# Patient Record
Sex: Male | Born: 2005 | Race: White | Hispanic: No | Marital: Single | Smoking: Never smoker
Health system: Southern US, Community
[De-identification: ages and names within clinical notes are randomized; demographics above are authoritative.]

## PROBLEM LIST (undated history)

## (undated) DIAGNOSIS — I10 Essential (primary) hypertension: Secondary | ICD-10-CM

## (undated) HISTORY — DX: Essential (primary) hypertension: I10

---
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
## 2023-01-15 ENCOUNTER — Encounter: Payer: Self-pay | Admitting: Internal Medicine

## 2023-01-15 ENCOUNTER — Ambulatory Visit: Payer: Federal, State, Local not specified - PPO | Attending: Internal Medicine | Admitting: Internal Medicine

## 2023-01-15 VITALS — BP 126/62 | HR 71 | Ht 69.0 in | Wt 156.0 lb

## 2023-01-15 DIAGNOSIS — I10 Essential (primary) hypertension: Secondary | ICD-10-CM

## 2023-01-15 DIAGNOSIS — Z136 Encounter for screening for cardiovascular disorders: Secondary | ICD-10-CM | POA: Diagnosis not present

## 2023-01-15 DIAGNOSIS — R42 Dizziness and giddiness: Secondary | ICD-10-CM | POA: Diagnosis not present

## 2023-01-15 NOTE — Patient Instructions (Signed)
Medication Instructions:  NO CHANGES  *If you need a refill on your cardiac medications before your next appointment, please call your pharmacy*   Follow-Up: At [facility], you and your health needs are our priority.  As part of our continuing mission to provide you with exceptional heart care, we have created designated Provider Care Teams.  These Care Teams include your primary Cardiologist (physician) and Advanced Practice Providers (APPs -  Physician Assistants and Nurse Practitioners) who all work together to provide you with the care you need, when you need it.  We recommend signing up for the patient portal called "MyChart".  Sign up information is provided on this After Visit Summary.  MyChart is used to connect with patients for Virtual Visits (Telemedicine).  Patients are able to view lab/test results, encounter notes, upcoming appointments, etc.  Non-urgent messages can be sent to your provider as well.   To learn more about what you can do with MyChart, go to [email or web address].    Your next appointment:    12 months with Dr. Hilty  

## 2023-01-15 NOTE — Progress Notes (Signed)
OFFICE CONSULT NOTE  Chief Complaint:  Established cardiologist  Primary Care Physician: No primary care provider on file.  HPI:  Justin Maldonado is a 17 y.o. male who is being seen today for the evaluation of high blood pressure at the request of Cullop, Adele Barthel, NP.  This is a 17 year old male who has a history of high blood pressure.  This is on his father side.  Previously I have seen both his twin brother and sister who have similar histories of hypertension.  He has also received care at pediatric nephrology at [facility] until he aged out of that.  Today blood pressure appears to be well-controlled at 126/62.  According to his mother who accompanied him during the visit he has had some dizziness recently.  It sounds like he may have had some upper respiratory and sinus symptoms which could have contributed to that.  He did have an elevated blood pressure in the 140s, unlikely to be a cause of his dizziness.  Sometimes the dizziness is positional.  That being said he did not have any the symptoms on the blood pressure medications which he has been on for more than 6 months and it seems unlikely to be related to the medicine.  Otherwise he is asymptomatic.  He is currently at [employer] and works in [employer].  PMHx:  Past Medical History:  Diagnosis Date   HTN (hypertension)     History reviewed. No pertinent surgical history.  FAMHx:  Family History  Problem Relation Age of Onset   Hypertension Father    Hypertension Sister    Hypertension Brother     SOCHx:   reports that he has never smoked. He has never used smokeless tobacco. No history on file for alcohol use and drug use.  ALLERGIES:  Allergies  Allergen Reactions   Amoxicillin Rash    ROS: Pertinent items noted in HPI and remainder of comprehensive ROS otherwise negative.  HOME MEDS: Current Outpatient Medications on File Prior to Visit  Medication Sig Dispense Refill   amlodipine-olmesartan (AZOR) 10-20 MG  tablet Take 1 tablet by mouth daily.     Ferrous Sulfate (IRON PO) Take by mouth.     No current facility-administered medications on file prior to visit.    LABS/IMAGING: No results found for this or any previous visit (from the past 48 hour(s)). No results found.  LIPID PANEL: No results found for: "CHOL", "TRIG", "HDL", "CHOLHDL", "VLDL", "LDLCALC", "LDLDIRECT"  WEIGHTS: Wt Readings from Last 3 Encounters:  01/15/23 156 lb (70.8 kg) (54%, Z= 0.09)*   * Growth percentiles are based on CDC (Boys, 2-20 Years) data.    VITALS: BP 126/62 (BP Location: Left Arm, Patient Position: Sitting, Cuff Size: Normal)   Pulse 71   Ht 5\' 9"  (1.753 m)   Wt 156 lb (70.8 kg)   SpO2 99%   BMI 23.04 kg/m   EXAM: General appearance: alert and no distress Lungs: clear to auscultation bilaterally Heart: regular rate and rhythm Extremities: extremities normal, atraumatic, no cyanosis or edema Neurologic: Grossly normal  EKG: EKG Interpretation Date/Time:  Wednesday January 15 2023 16:09:05 EDT Ventricular Rate:  71 PR Interval:  172 QRS Duration:  92 QT Interval:  360 QTC Calculation: 391 R Axis:   73  Text Interpretation: Normal sinus rhythm with sinus arrhythmia Normal ECG No previous ECGs available Confirmed by Zoila Shutter [id number]) on 01/15/2023 4:18:28 PM    ASSESSMENT: Essential hypertension Dizziness  PLAN: 1.   Mr.  Papadakis has essential hypertension as well as to twin siblings with hypertension.  He is established on medical therapy and appears to have good control.  He has had some recent occasional dizziness which I suspect is transient and will resolve.  He has been established on medication for some time and it seems unlikely to be the medication.  EKG is normal.  Would recommend continuing his current therapies.  Follow-up with me annually or sooner as necessary.  Justin Nose, MD, [facility], FACP  [place]  [facility]  Medical Director of the Advanced Lipid  Disorders &  Cardiovascular Risk Reduction Clinic Diplomate of the American Board of Clinical Lipidology Attending Cardiologist  Direct Dial: [phone]  Fax: [phone]  Website:  [email or web address] Nicely Justin Maldonado 01/15/2023, 5:04 PM

## 2023-02-26 DIAGNOSIS — E559 Vitamin D deficiency, unspecified: Secondary | ICD-10-CM | POA: Diagnosis not present

## 2023-02-26 DIAGNOSIS — I10 Essential (primary) hypertension: Secondary | ICD-10-CM | POA: Diagnosis not present

## 2023-05-16 DIAGNOSIS — J309 Allergic rhinitis, unspecified: Secondary | ICD-10-CM | POA: Insufficient documentation

## 2023-06-13 DIAGNOSIS — R112 Nausea with vomiting, unspecified: Secondary | ICD-10-CM | POA: Diagnosis not present

## 2023-06-13 DIAGNOSIS — R197 Diarrhea, unspecified: Secondary | ICD-10-CM | POA: Diagnosis not present

## 2023-06-13 DIAGNOSIS — R109 Unspecified abdominal pain: Secondary | ICD-10-CM | POA: Diagnosis not present

## 2023-06-18 ENCOUNTER — Emergency Department (HOSPITAL_BASED_OUTPATIENT_CLINIC_OR_DEPARTMENT_OTHER)

## 2023-06-18 ENCOUNTER — Other Ambulatory Visit: Payer: Self-pay

## 2023-06-18 ENCOUNTER — Emergency Department (HOSPITAL_BASED_OUTPATIENT_CLINIC_OR_DEPARTMENT_OTHER)
Admission: EM | Admit: 2023-06-18 | Discharge: 2023-06-18 | Disposition: A | Discharge status: Home / Self Care | Attending: Emergency Medicine | Admitting: Emergency Medicine

## 2023-06-18 DIAGNOSIS — Z79899 Other long term (current) drug therapy: Secondary | ICD-10-CM | POA: Insufficient documentation

## 2023-06-18 DIAGNOSIS — Z760 Encounter for issue of repeat prescription: Secondary | ICD-10-CM | POA: Diagnosis not present

## 2023-06-18 DIAGNOSIS — R109 Unspecified abdominal pain: Secondary | ICD-10-CM | POA: Diagnosis not present

## 2023-06-18 DIAGNOSIS — I10 Essential (primary) hypertension: Secondary | ICD-10-CM | POA: Insufficient documentation

## 2023-06-18 DIAGNOSIS — R1084 Generalized abdominal pain: Secondary | ICD-10-CM | POA: Diagnosis not present

## 2023-06-18 LAB — CBC
HCT: 48.1 % (ref 39.0–52.0)
Hemoglobin: 15.8 g/dL (ref 13.0–17.0)
MCH: 26.7 pg (ref 26.0–34.0)
MCHC: 32.8 g/dL (ref 30.0–36.0)
MCV: 81.3 fL (ref 80.0–100.0)
Platelets: 258 10*3/uL (ref 150–400)
RBC: 5.92 MIL/uL — ABNORMAL HIGH (ref 4.22–5.81)
RDW: 13 % (ref 11.5–15.5)
WBC: 9.1 10*3/uL (ref 4.0–10.5)
nRBC: 0 % (ref 0.0–0.2)

## 2023-06-18 LAB — COMPREHENSIVE METABOLIC PANEL
ALT: 13 U/L (ref 0–44)
AST: 13 U/L — ABNORMAL LOW (ref 15–41)
Albumin: 5.1 g/dL — ABNORMAL HIGH (ref 3.5–5.0)
Alkaline Phosphatase: 56 U/L (ref 38–126)
Anion gap: 9 (ref 5–15)
BUN: 9 mg/dL (ref 6–20)
CO2: 27 mmol/L (ref 22–32)
Calcium: 10.4 mg/dL — ABNORMAL HIGH (ref 8.9–10.3)
Chloride: 103 mmol/L (ref 98–111)
Creatinine, Ser: 1.05 mg/dL (ref 0.61–1.24)
GFR, Estimated: >60 mL/min (ref >60)
Glucose, Bld: 111 mg/dL — ABNORMAL HIGH (ref 70–99)
Potassium: 3.7 mmol/L (ref 3.5–5.1)
Sodium: 139 mmol/L (ref 135–145)
Total Bilirubin: 0.5 mg/dL (ref 0.0–1.2)
Total Protein: 7.7 g/dL (ref 6.5–8.1)

## 2023-06-18 LAB — URINALYSIS, ROUTINE W REFLEX MICROSCOPIC
Bilirubin Urine: NEGATIVE
Glucose, UA: NEGATIVE mg/dL
Hgb urine dipstick: NEGATIVE
Leukocytes,Ua: NEGATIVE
Nitrite: NEGATIVE
Specific Gravity, Urine: 1.02 (ref 1.005–1.030)
pH: 8 (ref 5.0–8.0)

## 2023-06-18 LAB — LIPASE, BLOOD: Lipase: 27 U/L (ref 11–51)

## 2023-06-18 MED ORDER — OLMESARTAN MEDOXOMIL 20 MG PO TABS: 20.0000 mg | ORAL_TABLET | Freq: Every day | ORAL | 0 refills | Status: DC

## 2023-06-18 MED ORDER — ONDANSETRON 4 MG PO TBDP: 4.0000 mg | ORAL_TABLET | Freq: Once | ORAL | Status: DC | PRN

## 2023-06-18 MED ORDER — IOHEXOL 300 MG/ML  SOLN
100.0000 mL | Freq: Once | INTRAMUSCULAR | Status: AC | PRN
  Administered 2023-06-18: 100 mL via INTRAVENOUS

## 2023-06-18 MED ORDER — ONDANSETRON 4 MG PO TBDP: 4.0000 mg | ORAL_TABLET | Freq: Three times a day (TID) | ORAL | 0 refills | Status: AC | PRN

## 2023-06-18 MED ORDER — ONDANSETRON HCL 4 MG/2ML IJ SOLN
4.0000 mg | Freq: Once | INTRAMUSCULAR | Status: AC
  Administered 2023-06-18: 4 mg via INTRAVENOUS
  Filled 2023-06-18: qty 2

## 2023-06-18 MED ORDER — SODIUM CHLORIDE 0.9 % IV BOLUS
500.0000 mL | Freq: Once | INTRAVENOUS | Status: AC
  Administered 2023-06-18: 500 mL via INTRAVENOUS

## 2023-06-18 NOTE — ED Notes (Signed)
 Patient transported to CT

## 2023-06-18 NOTE — ED Triage Notes (Signed)
 Pt POV reporting abd pain and nausea past week, seen at [facility] Friday, was told there was blood in his urine and to follow up with PCP, appt not for a few weeks.

## 2023-06-18 NOTE — Discharge Instructions (Addendum)
 CT imaging as well as lab work are reassuring here today.  I recommend staying well-hydrated with water he can alternate Pedialyte and Gatorade.  I have sent Zofran to your pharmacy reviewed you can take as needed for nausea vomiting.  You can also continue taking over-the-counter omeprazole or Pepcid.  If you have loose stools you can take odium or Pepto-Bismol which are available over-the-counter.  I would recommend bland food.  Please follow-up with your primary care doctor as discussed.  I have attached behavioral health urgent care formation to discharge paperwork.  I have sent medication refill to your pharmacy.  Please monitor your blood pressure and have your electrolytes and kidney function rechecked in approximately 1 to 2 weeks.

## 2023-06-18 NOTE — ED Provider Notes (Signed)
 [place] EMERGENCY DEPARTMENT AT [facility] Provider Note   CSN: [id number] Arrival date & time: 06/18/23  1457     History  Chief Complaint  Patient presents with   Hematuria   Abdominal Pain    Justin Maldonado is a 17 y.o. male with past medical history of hypertension presenting to emergency room with complaint of abdominal pain.  Patient reports that he has had 10 days of nausea, vomiting, diarrhea and periumbilical abdominal pain.  He reports sick contact.  Reports this is gradually getting better but continues to have loose stools.  Reports 1 or 2 loose stools per day.  Has been tolerating oral intake.  Has been trying Pepto-Bismol with relief at home.  Also saw urgent care and was given Zofran.  Patient's mother reports that when he was evaluated urgent care he had a small amount of blood in his urine and he was supposed to follow-up with primary care but since he cannot get in for a while he came here for further evaluation. No recent travel.  Denies any fevers, chills, cough, congestion shortness of breath or chest pain. No prior abdominal surgeries. Reports he has been feeling very anxious and slightly down. Denies SI, HI. Was recently taken off of Lexapro.    Hematuria Associated symptoms include abdominal pain.  Abdominal Pain Associated symptoms: hematuria        Home Medications Prior to Admission medications   Medication Sig Start Date End Date Taking? Authorizing Provider  amlodipine-olmesartan (AZOR) 10-20 MG tablet Take 1 tablet by mouth daily.    [provider]  Ferrous Sulfate (IRON PO) Take by mouth.    [provider]      Allergies    Amoxicillin    Review of Systems   Review of Systems  Gastrointestinal:  Positive for abdominal pain.  Genitourinary:  Positive for hematuria.    Physical Exam Updated Vital Signs BP (!) 155/101   Pulse 66   Temp 98.1 F (36.7 C) (Oral)   Resp 17   Ht 5\' 9"  (1.753 m)   Wt 68 kg    SpO2 100%   BMI 22.15 kg/m  Physical Exam Vitals and nursing note reviewed.  Constitutional:      General: He is not in acute distress.    Appearance: He is not toxic-appearing.  HENT:     Head: Normocephalic and atraumatic.  Eyes:     General: No scleral icterus.    Conjunctiva/sclera: Conjunctivae normal.  Cardiovascular:     Rate and Rhythm: Normal rate and regular rhythm.     Pulses: Normal pulses.     Heart sounds: Normal heart sounds.  Pulmonary:     Effort: Pulmonary effort is normal. No respiratory distress.     Breath sounds: Normal breath sounds.  Abdominal:     General: Abdomen is flat. Bowel sounds are normal.     Palpations: Abdomen is soft.     Tenderness: There is abdominal tenderness. There is no right CVA tenderness or left CVA tenderness.  Musculoskeletal:     Right lower leg: No edema.     Left lower leg: No edema.  Skin:    General: Skin is warm and dry.     Findings: No lesion.  Neurological:     General: No focal deficit present.     Mental Status: He is alert and oriented to person, place, and time. Mental status is at baseline.     ED Results / Procedures / Treatments  Labs (all labs ordered are listed, but only abnormal results are displayed) Labs Reviewed  COMPREHENSIVE METABOLIC PANEL - Abnormal; Notable for the following components:      Result Value   Glucose, Bld 111 (*)    Calcium 10.4 (*)    Albumin 5.1 (*)    AST 13 (*)    All other components within normal limits  CBC - Abnormal; Notable for the following components:   RBC 5.92 (*)    All other components within normal limits  URINALYSIS, ROUTINE W REFLEX MICROSCOPIC - Abnormal; Notable for the following components:   Ketones, ur TRACE (*)    Protein, ur TRACE (*)    All other components within normal limits  LIPASE, BLOOD    EKG None  Radiology CT ABDOMEN PELVIS W CONTRAST Result Date: 06/18/2023 CLINICAL DATA:  Acute abdominal pain and nausea for 1 week. EXAM: CT  ABDOMEN AND PELVIS WITH CONTRAST TECHNIQUE: Multidetector CT imaging of the abdomen and pelvis was performed using the standard protocol following bolus administration of intravenous contrast. RADIATION DOSE REDUCTION: This exam was performed according to the departmental dose-optimization program which includes automated exposure control, adjustment of the mA and/or kV according to patient size and/or use of iterative reconstruction technique. CONTRAST:  OMNIPAQUE IOHEXOL 300 MG/ML  SOLN COMPARISON:  None Available. FINDINGS: Lower Chest: No acute findings. Hepatobiliary: No suspicious hepatic masses identified. Gallbladder is unremarkable. No evidence of biliary ductal dilatation. Pancreas:  No mass or inflammatory changes. Spleen: Within normal limits in size and appearance. Adrenals/Urinary Tract: No suspicious masses identified. No evidence of ureteral calculi or hydronephrosis. Stomach/Bowel: No evidence of obstruction, inflammatory process or abnormal fluid collections. Normal appendix visualized. Vascular/Lymphatic: No pathologically enlarged lymph nodes. No acute vascular findings. Reproductive:  No mass or other significant abnormality. Other:  None. Musculoskeletal:  No suspicious bone lesions identified. IMPRESSION: Negative. No acute findings or other significant abnormality. Electronically Signed   By: Danae Orleans M.D.   On: 06/18/2023 20:14    Procedures Procedures    Medications Ordered in ED Medications  sodium chloride 0.9 % bolus 500 mL (0 mLs Intravenous Stopped 06/18/23 1903)  ondansetron (ZOFRAN) injection 4 mg (4 mg Intravenous Given 06/18/23 1802)  iohexol (OMNIPAQUE) 300 MG/ML solution 100 mL (100 mLs Intravenous Contrast Given 06/18/23 1747)    ED Course/ Medical Decision Making/ A&P                                 Medical Decision Making Amount and/or Complexity of Data Reviewed Labs: ordered. Radiology: ordered.  Risk Prescription drug management.   Justin Maldonado 18 y.o. presented today for abd pain. Working DDx includes, but not limited to, gastroenteritis, colitis, SBO, appendicitis, cholecystitis, hepatobiliary pathology, gastritis, PUD, ACS, dissection, pancreatitis, nephrolithiasis, AAA, UTI, pyelonephritis, torsion.   R/o DDx: These are considered less likely than current impression due to history of present illness, physical exam, labs/imaging findings.  Pmhx: hypertension   Unique Tests and My Interpretation:  CBC without leukocytosis and no anemia.  CMP without significant electrolyte abnormality.  No AKI.  Lipase is 27.  UA with trace ketones and trace protein and no hgb  Imaging:  CT Abd/Pelvis with contrast: evaluate for structural/surgical etiology of patients' severe abdominal pain -- no acute findings.    Problem List / ED Course / Critical interventions / Medication management  Patient reporting to emergency room with 10 days of nausea vomiting  diarrhea and decreased appetite.  He also has some generalized abdominal pain.  He has been tolerating oral intake throughout stay.  On exam he has generalized tenderness.  Abdomen is soft and nondistended.  Discussed not getting imaging secondary to generalized tenderness however patient family ember in room would like to proceed with CT imaging to further characterize pain.  He is hemodynamically stable and well-appearing.  Will closely reassess after Zofran and fluids.  Labs are reassuring here without leukocytosis and no anemia.  No significant electrolyte abnormality.  CT scan without any acute pathology.  Patient reports he is feeling better after fluids and Zofran.  He is tolerating oral intake.  He is requesting a refill on his medication.  Patient takes Olmesartan 20mg  daily. I sent refill to pharmacy. He will have labs rechecked.  He will also follow-up with his primary care doctor in regards to today's visit and findings. I ordered medication including zofran, NS Reevaluation of the  patient after these medicines showed that the patient improved Patients vitals assessed. Upon arrival patient is hemodynamically stable.  I have reviewed the patients home medicines and have made adjustments as needed   Consult: None  Plan: Patient discharged home with stable condition.  Given return precautions.        Final Clinical Impression(s) / ED Diagnoses Final diagnoses:  Generalized abdominal pain  Medication refill    Rx / DC Orders ED Discharge Orders          Ordered    ondansetron (ZOFRAN-ODT) 4 MG disintegrating tablet  Every 8 hours PRN        06/18/23 2112    olmesartan (BENICAR) 20 MG tablet  Daily        06/18/23 2112              Safiyyah Vasconez, Horald Chestnut, PA-C 06/18/23 2136    Rondel Baton, MD 06/19/23 1106

## 2023-07-29 ENCOUNTER — Ambulatory Visit: Payer: Federal, State, Local not specified - PPO | Admitting: Family Medicine

## 2023-07-29 ENCOUNTER — Encounter: Payer: Self-pay | Admitting: Family Medicine

## 2023-07-29 VITALS — BP 136/78 | HR 91 | Temp 98.3°F | Ht 68.0 in | Wt 143.4 lb

## 2023-07-29 DIAGNOSIS — I10 Essential (primary) hypertension: Secondary | ICD-10-CM

## 2023-07-29 LAB — MICROALBUMIN / CREATININE URINE RATIO
Creatinine,U: 131.5 mg/dL
Microalb Creat Ratio: 5.4 mg/g (ref 0.0–30.0)
Microalb, Ur: 0.7 mg/dL (ref 0.0–1.9)

## 2023-07-29 MED ORDER — OLMESARTAN MEDOXOMIL 20 MG PO TABS: 20.0000 mg | ORAL_TABLET | Freq: Every day | ORAL | 0 refills | Status: AC

## 2023-07-29 NOTE — Progress Notes (Signed)
 [facility] PRIMARY CARE [facility] [address] [place] [address] Dept: [phone] Dept Fax: [phone]  New Patient Office Visit  Subjective:    Patient ID: Justin Maldonado, male    DOB: May 18, 2005, 18 y.o..   MRN: [id number]  Chief Complaint  Patient presents with   Establish Care    NP-  Establish care.  C/o having elevated BP.     History of Present Illness:  Patient is in today to establish care. Mr. Weider was born in [place]. He is a triplet (2 boys, 1 girl). He is currently taking general classes at [employer] as he figures out what he will do next. 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 He currently lives with his parents. He is single and has no children. he denies use of alcohol, tobacco, or drugs.  Mr. Munter has a history of hypertension, diagnosed at age ~ 70.  He is managed on olmesartan. He has a mild associated cough, which he tolerates. He had previously been treated with amlodipine and lisinopril, which he had not tolerated due to side effects.  Mr. Torrence has a history of anxiety. He is managed with counseling and the use of ashwagandha and another herbal product. He feels he is doing well for now.   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 No pertinent surgical history. Family History  Problem Relation Age of Onset   Hypertension Father    Hypertension Sister    Hypertension Brother    Heart disease Paternal Uncle    Stroke Maternal Grandmother    Cancer Paternal Grandmother        Non-Hodgkins lymphoma   Outpatient Medications Prior to Visit  Medication Sig Dispense Refill   ondansetron (ZOFRAN-ODT) 4 MG  disintegrating tablet Take 1 tablet (4 mg total) by mouth every 8 (eight) hours as needed for nausea or vomiting. 20 tablet 0   olmesartan (BENICAR) 20 MG tablet Take 1 tablet (20 mg total) by mouth daily. 60 tablet 0   Ferrous Sulfate (IRON PO) Take by mouth.     No facility-administered medications prior to visit.   Allergies  Allergen Reactions   Amoxicillin Rash   Objective:   Today's Vitals   07/29/23 1409  BP: 136/78  Pulse: 91  Temp: 98.3 F (36.8 C)  TempSrc: Temporal  SpO2: 99%  Weight: 143 lb 6.4 oz (65 kg)  Height: 5\' 8"  (1.727 m)   Body mass index is 21.8 kg/m.   General: Well developed, well nourished. No acute distress. Psych: Alert and oriented. Normal mood and affect.  Health Maintenance Due  Topic Date Due   HPV VACCINES (1 - Male 3-dose series) Never done   HIV Screening  Never done   Meningococcal B Vaccine (1 of 2 - Standard) Never done   Hepatitis C Screening  Never done   Lab Results    Latest Ref Rng & Units 06/18/2023    4:02 PM  CBC  WBC 4.0 - 10.5 K/uL 9.1   Hemoglobin 13.0 - 17.0 g/dL 69.6   Hematocrit 29.5 - 52.0 % 48.1   Platelets 150 - 400 K/uL 258        Latest Ref Rng & Units  06/18/2023    4:02 PM  CMP  Glucose 70 - 99 mg/dL 409   BUN 6 - 20 mg/dL 9   Creatinine 8.11 - 9.14 mg/dL 7.82   Sodium 956 - 213 mmol/L 139   Potassium 3.5 - 5.1 mmol/L 3.7   Chloride 98 - 111 mmol/L 103   CO2 22 - 32 mmol/L 27   Calcium 8.9 - 10.3 mg/dL 08.6   Total Protein 6.5 - 8.1 g/dL 7.7   Total Bilirubin 0.0 - 1.2 mg/dL 0.5   Alkaline Phos 38 - 126 U/L 56   AST 15 - 41 U/L 13   ALT 0 - 44 U/L 13    Component Ref Range & Units (hover) 1 mo ago  Color, Urine YELLOW  APPearance CLEAR  Specific Gravity, Urine 1.020  pH 8.0  Glucose, UA NEGATIVE  Hgb urine dipstick NEGATIVE  Bilirubin Urine NEGATIVE  Ketones, ur TRACE Abnormal   Protein, ur TRACE Abnormal   Nitrite NEGATIVE  Leukocytes,Ua NEGATIVE      Assessment & Plan:   Problem  List Items Addressed This Visit       Cardiovascular and Mediastinum   Essential (primary) hypertension - Primary   Mr. Kaster's BP is mildly above goal today. I will have him continue olmesartan 20 mg daily. He had a UA last month that showed a trace positive for protein. I will recheck a UA today and a micral. I recommend he monitor his BP at home. I will see him back in 3 months to reassess his BP.      Relevant Medications   olmesartan (BENICAR) 20 MG tablet   Other Relevant Orders   Urinalysis w microscopic + reflex cultur   Microalbumin / creatinine urine ratio    Return in about 3 months (around 10/28/2023) for Reassessment.   Graig Lawyer, MD

## 2023-07-29 NOTE — Assessment & Plan Note (Signed)
 Mr. Vanrossum's BP is mildly above goal today. I will have him continue olmesartan 20 mg daily. He had a UA last month that showed a trace positive for protein. I will recheck a UA today and a micral. I recommend he monitor his BP at home. I will see him back in 3 months to reassess his BP.

## 2023-07-30 LAB — URINALYSIS W MICROSCOPIC + REFLEX CULTURE
Bacteria, UA: NONE SEEN /HPF
Bilirubin Urine: NEGATIVE
Glucose, UA: NEGATIVE
Hgb urine dipstick: NEGATIVE
Hyaline Cast: NONE SEEN /LPF
Ketones, ur: NEGATIVE
Leukocyte Esterase: NEGATIVE
Nitrites, Initial: NEGATIVE
Protein, ur: NEGATIVE
Specific Gravity, Urine: 1.021 (ref 1.001–1.035)
Squamous Epithelial / HPF: NONE SEEN /HPF (ref <=5)
WBC, UA: NONE SEEN /HPF (ref 0–5)
pH: 8.5 — ABNORMAL HIGH (ref 5.0–8.0)

## 2023-07-30 LAB — NO CULTURE INDICATED

## 2023-09-15 DIAGNOSIS — I10 Essential (primary) hypertension: Secondary | ICD-10-CM | POA: Diagnosis not present

## 2023-09-15 DIAGNOSIS — F429 Obsessive-compulsive disorder, unspecified: Secondary | ICD-10-CM | POA: Diagnosis not present

## 2023-10-13 DIAGNOSIS — I10 Essential (primary) hypertension: Secondary | ICD-10-CM | POA: Diagnosis not present

## 2023-10-13 DIAGNOSIS — F429 Obsessive-compulsive disorder, unspecified: Secondary | ICD-10-CM | POA: Diagnosis not present

## 2023-10-28 ENCOUNTER — Ambulatory Visit: Admitting: Family Medicine

## 2023-11-14 ENCOUNTER — Encounter: Payer: Self-pay | Admitting: Family Medicine

## 2023-12-18 DIAGNOSIS — I10 Essential (primary) hypertension: Secondary | ICD-10-CM | POA: Diagnosis not present

## 2023-12-18 DIAGNOSIS — R5383 Other fatigue: Secondary | ICD-10-CM | POA: Diagnosis not present

## 2023-12-18 DIAGNOSIS — H9313 Tinnitus, bilateral: Secondary | ICD-10-CM | POA: Diagnosis not present

## 2023-12-18 DIAGNOSIS — F429 Obsessive-compulsive disorder, unspecified: Secondary | ICD-10-CM | POA: Diagnosis not present

## 2023-12-25 ENCOUNTER — Encounter: Payer: Self-pay | Admitting: Internal Medicine

## 2024-01-20 DIAGNOSIS — I10 Essential (primary) hypertension: Secondary | ICD-10-CM | POA: Diagnosis not present

## 2024-01-20 DIAGNOSIS — H9313 Tinnitus, bilateral: Secondary | ICD-10-CM | POA: Diagnosis not present

## 2024-01-22 DIAGNOSIS — H9313 Tinnitus, bilateral: Secondary | ICD-10-CM | POA: Diagnosis not present
